# Patient Record
Sex: Female | Born: 1975 | Race: White | Hispanic: No | Marital: Married | Smoking: Former smoker
Health system: Southern US, Community
[De-identification: ages and names within clinical notes are randomized; demographics above are authoritative.]

## PROBLEM LIST (undated history)

## (undated) ENCOUNTER — Inpatient Hospital Stay (HOSPITAL_COMMUNITY): Payer: Self-pay

## (undated) DIAGNOSIS — U071 COVID-19: Secondary | ICD-10-CM

## (undated) DIAGNOSIS — I739 Peripheral vascular disease, unspecified: Secondary | ICD-10-CM

## (undated) HISTORY — DX: COVID-19: U07.1

## (undated) HISTORY — PX: TONSILLECTOMY AND ADENOIDECTOMY: SHX28

## (undated) HISTORY — DX: Peripheral vascular disease, unspecified: I73.9

## (undated) HISTORY — PX: CHOLECYSTECTOMY: SHX55

---

## 2008-01-10 ENCOUNTER — Encounter: Admission: RE | Admit: 2008-01-10 | Discharge: 2008-01-10 | Payer: Self-pay | Admitting: Obstetrics & Gynecology

## 2008-03-08 ENCOUNTER — Encounter (INDEPENDENT_AMBULATORY_CARE_PROVIDER_SITE_OTHER): Payer: Self-pay | Admitting: Obstetrics and Gynecology

## 2008-03-08 ENCOUNTER — Inpatient Hospital Stay (HOSPITAL_COMMUNITY): Admission: AD | Admit: 2008-03-08 | Discharge: 2008-03-13 | Payer: Self-pay | Admitting: Obstetrics & Gynecology

## 2009-07-30 ENCOUNTER — Ambulatory Visit (HOSPITAL_COMMUNITY): Admission: RE | Admit: 2009-07-30 | Discharge: 2009-07-30 | Payer: Self-pay | Admitting: Gastroenterology

## 2009-09-06 ENCOUNTER — Encounter (INDEPENDENT_AMBULATORY_CARE_PROVIDER_SITE_OTHER): Payer: Self-pay | Admitting: General Surgery

## 2009-09-06 ENCOUNTER — Ambulatory Visit (HOSPITAL_COMMUNITY): Admission: RE | Admit: 2009-09-06 | Discharge: 2009-09-06 | Payer: Self-pay | Admitting: General Surgery

## 2010-01-26 IMAGING — NM NM HEPATO W/GB/PHARM/[PERSON_NAME]
2 series · 12 of 12 positions shown · non-contrast
Comparison: None

CLINICAL DATA: History given of abdominal pain

NUCLEAR MEDICINE HEPATOBILIARY IMAGING WITH GALLBLADDER EF
TECHNIQUE: Sequential images of the abdomen were obtained [DATE]
minutes following intravenous administration of
radiopharmaceutical. After slow intravenous infusion of 2.0 uCg
Cholecystokinin, gallbladder ejection fraction was determined.
Radiopharmaceutical: 4.5 mCi 6c-LLm Choletec

[Series 1: he hepato · 4.71mm/px · 6 of 60 frames shown (1 of 2)]
[frame 6/60]
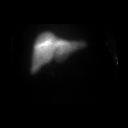
[frame 16/60]
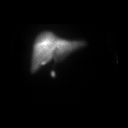
[frame 26/60]
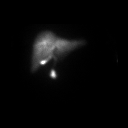
[frame 36/60]
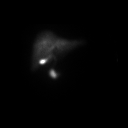
[frame 46/60]
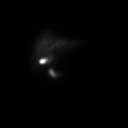
[frame 56/60]
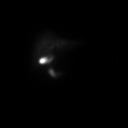

[Series 1: he hepato · 4.71mm/px · 6 of 30 frames shown (2 of 2)]
[frame 3/30]
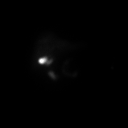
[frame 8/30]
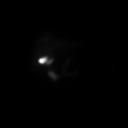
[frame 13/30]
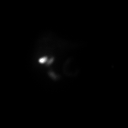
[frame 18/30]
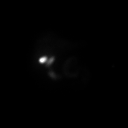
[frame 23/30]
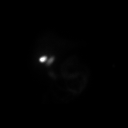
[frame 28/30]
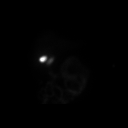

[12 of 12 positions shown; findings below may reference images not displayed]

FINDINGS: There is prompt visualization of hepatic activity. There
is prompt visualization of the common bile duct. Subsequently
intestinal activity was identified.

The gallbladder began to visualize at 15 minutes.

During CCK infusion the gallbladder contracted 10.6%. 30% or
greater is normal range.

During CCK infusion the patient reported abdominal pain.
IMPRESSION: There is demonstration of patency of the common bile duct and the
cystic duct. There is no evidence of cholecystitis.

During CCK infusion the gallbladder contracted 10.6%.  This is
abnormally low. 30% or greater is normal range.

During CCK infusion the patient reported abdominal pain.

## 2011-02-04 LAB — BASIC METABOLIC PANEL
Calcium: 9 mg/dL (ref 8.4–10.5)
GFR calc Af Amer: >60 mL/min (ref >60)
GFR calc non Af Amer: >60 mL/min (ref >60)
Potassium: 4.7 mEq/L (ref 3.5–5.1)
Sodium: 139 mEq/L (ref 135–145)

## 2011-02-04 LAB — DIFFERENTIAL
Basophils Absolute: 0.2 10*3/uL — ABNORMAL HIGH (ref 0.0–0.1)
Eosinophils Relative: 1 % (ref 0–5)
Lymphocytes Relative: 42 % (ref 12–46)
Lymphs Abs: 4 10*3/uL (ref 0.7–4.0)
Monocytes Absolute: 0.4 10*3/uL (ref 0.1–1.0)
Monocytes Relative: 4 % (ref 3–12)
Neutro Abs: 4.9 10*3/uL (ref 1.7–7.7)

## 2011-02-04 LAB — CBC
HCT: 37.8 % (ref 36.0–46.0)
Hemoglobin: 12.7 g/dL (ref 12.0–15.0)
RBC: 4.28 MIL/uL (ref 3.87–5.11)
RDW: 14.1 % (ref 11.5–15.5)
WBC: 9.4 10*3/uL (ref 4.0–10.5)

## 2011-03-17 NOTE — Op Note (Signed)
NAMEBORGHILD, THAKER          ACCOUNT NO.:  [id number]   MEDICAL RECORD NO.:  [id number]          PATIENT TYPE:  INP   LOCATION:  9372                          FACILITY:  [facility]   PHYSICIAN:  Maxie Better, M.D.DATE OF BIRTH:  May 07, 1976   DATE OF PROCEDURE:  03/08/2008  DATE OF DISCHARGE:                               OPERATIVE REPORT   PREOPERATIVE DIAGNOSES:  1. Severe preeclampsia, class A2 gestational diabetes.  2. Intrauterine gestation at 38+ weeks.  3. Transverse lie.  4:  Fetal macrosomia   PROCEDURE:  Primary cesarean section Kerr hysterotomy.   POSTOPERATIVE DIAGNOSES:  1. Polyhydramnios.  2. Fetal macrosomia, class A2 gestational diabetes.  3. Severe preeclampsia.  4. Intrauterine gestation at 38+ weeks.  5. Vertex presentation, class A2 gestational diabetes.   ANESTHESIA:  Spinal.   SURGEON:  Maxie Better, MD   ASSISTANT:  Marlinda Mike, CNM   INDICATIONS:  This is 35 year old gravida 1, para 0 female at 51+ weeks'  gestation with class A2 gestational diabetes, who was found to have  preeclampsia.  Estimated fetal weight of 10 pounds 6 ounces and  transverse presentation, who is now being admitted for primary cesarean  section.  Surgical risk was reviewed with the patient and her family.  Consent was signed and the patient was transferred to the operating  room.   PROCEDURE:  Under adequate spinal anesthesia, the patient was placed in  a supine position with a left lateral tilt.  She was sterilely prepped  and draped in usual fashion.  An indwelling Foley catheter was sterilely  placed.  Pfannenstiel skin incision was made above the pannus.  After  0.25% Marcaine was injected, the incision was carried down to the rectus  fascia.  The rectus fascia was opened transversely.  The rectus fascia  was then bluntly and sharply dissected off the rectus muscle in superior  and inferior fashion.  The rectus muscles was split in midline, parietal  peritoneum was entered bluntly and extended.  The vesicouterine  peritoneum was opened transversely.  The vertex presentation was well  audible.  The bladder was bluntly dissected off the lower uterine  segment and displaced inferiorly with a bladder retraction.  Curvilinear  low transverse uterine incision was then made and extended with bandage  scissors.  Artificial rupture of membranes was then done.  Clear copious  amniotic fluid was noted.  Vertex presentation was floating.  Initial  attempted delivery was unsuccessful.  Copious amount of blood suggestive  of placental separation was noted.  The vacuum was then applied with one  traction and pulled the subsequent delivery of a live female with cord  around the neck x2, which was reducible.  Baby was bulb suctioned in the  abdomen.  The cord was clamped and cut, the baby was transferred to the  awaiting pediatrician, who assigned Apgars of 8 and 9 at 1 and 5  minutes.  The placenta was spontaneously intact, sent to pathology.  Uterine cavity was cleaned of debris.  The uterus was exteriorized.  Uterine incision had no extension.  It was closed in two layers, the  first layer 0-Monocryl  with running locked stitch, second layer was  imbricated using 0-Monocryl suture.  Normal tubes and ovaries were noted  bilaterally.  The abdomen was then copiously irrigated and suctioned of  debris.  The uterus was then returned to the abdominal cavity.  Incision  was reinspected.  Good hemostasis noted.  The parietal peritoneum was  not closed.  The opening had been under the rectus muscle.  The rectus  fascia was closed with 0-Vicryl x2.  The subcutaneous area, which was  about 2-inch deep was closed with two layers of 2-0 plain sutures and  the skin was approximated using the Ethicon staples.   SPECIMENS:  Placenta sent to pathology.   ESTIMATED BLOOD LOSS:  700 mL.   INTRAOPERATIVE FLUID:  1800 mL crystalloid.   URINE OUTPUT:  100 mL clear  yellow urine.   Sponge and instrument counts x2 was correct.  Complication was none.  Weight of the baby was 12 pounds and 12 ounces.  The patient tolerated  the procedure well and was transferred to recovery in stable condition.      Maxie Better, M.D.  Electronically Signed     [place]/MEDQ  D:  03/08/2008  T:  03/09/2008  Job:  [id number]

## 2011-03-20 NOTE — Discharge Summary (Signed)
Katherine Walsh, Katherine Walsh          ACCOUNT NO.:  [id number]   MEDICAL RECORD NO.:  [id number]          PATIENT TYPE:  INP   LOCATION:  9145                          FACILITY:  [facility]   PHYSICIAN:  Maxie Better, M.D.DATE OF BIRTH:  03-03-1976   DATE OF ADMISSION:  03/08/2008  DATE OF DISCHARGE:  03/13/2008                               DISCHARGE SUMMARY   ADMISSION DIAGNOSES:  1. Fetal macrosomia.  2. Severe preeclampsia.  3. Class A2 gestational diabetes.  4. Transverse lie.  5. Intrauterine gestation at 38+ weeks.   POSTOPERATIVE DIAGNOSES:  1. Polyhydramnios.  2. Fetal macrosomia.  3. Class A2 gestational diabetes.  4. Severe preeclampsia.  5. Intrauterine gestation at 38 weeks, delivered.   PROCEDURE:  Primary cesarean section, Kerr hysterotomy.   HOSPITAL COURSE:  This is a patient of Dr. Seymour Bars, who has class A2  gestational diabetes and was found to have fetal macrosomia with  transverse lie being admitted secondary to her estimated fetal weight  and presumed severe preeclampsia.  The estimated fetal weight was 10  pounds 6 ounces.  The patient was found to be transverse lie.  Blood  pressure was 145/92.  She was on insulin for her diabetes, which was  being managed in the office.  On admission, PIH labs were obtained,  which were notable with normal except for the elevation of her uric  acid.  The patient was taken to the operating room, where she underwent  a primary cesarean section, live female, cord around the neck x2, 12  pounds 12 ounces, head tilted from a transverse to the vertex  presentation.  By the time she was taken to the operating room, normal  tubes and ovaries were noted.  Postoperatively, the patient was placed  on magnesium sulfate.  She was placed in the Intensive Care Unit for  close monitoring.  PIH labs were obtained serially.  Blood pressures on  postop day #1 ranged between 132-160 over 88-102.  She was continued on  magnesium until her  diuresis occurred.  The diagnosis of severe  preeclampsia had been made with the patient's urine total protein  collection noted to be 6182 mg of protein.  The patient had blood sugar  monitoring performed.  Labetalol was started because of continued  elevation of her blood pressure.  Magnesium sulfate was discontinued  after the patient was diuresing.  She was placed on nifedipine 10 mg  p.o. t.i.d. and Labetalol 800 mg p.o. t.i.d.  By postop day #5, her  blood pressures were 139-152 over 90-93.  Her blood sugars had improved.  She was deemed well for discharge home.  Her incision had no erythema,  induration, or exudate.  Staples were not removed.  Her CBC on postop  day #1 has showed a hematocrit 34.7,  hemoglobin of 11.9, platelet count  of 176,000, and white count of 9.8.   DISPOSITION:  Home.   CONDITION:  Stable.   DISCHARGE MEDICATIONS:  1. Nifedipine 10 mg every 8 hours.  2. Labetalol 400 mg every 8 hours.  3. Motrin 600-800 mg every 8 hours.  4. Percocet 1-2 tablets every 6  hours p.r.n. pain.  5. Prenatal vitamins 1 p.o. daily.   FOLLOWUP APPOINTMENT:  With [facility] for blood pressure check and  staple removal on Thursday or Friday.   DISCHARGE INSTRUCTIONS:  Per the postpartum booklet given.      Maxie Better, M.D.  Electronically Signed     [place]/MEDQ  D:  04/22/2008  T:  04/23/2008  Job:  [id number]

## 2021-04-08 ENCOUNTER — Other Ambulatory Visit: Payer: Self-pay

## 2021-04-08 DIAGNOSIS — U071 COVID-19: Secondary | ICD-10-CM

## 2021-04-08 NOTE — Addendum Note (Signed)
Addended byWaynetta Pean on: 04/08/2021 05:10 PM   Modules accepted: Orders

## 2021-04-08 NOTE — Progress Notes (Addendum)
Error, Patient does need imaging

## 2021-04-09 ENCOUNTER — Encounter (HOSPITAL_COMMUNITY): Payer: Self-pay

## 2021-04-09 ENCOUNTER — Encounter: Payer: Self-pay | Admitting: *Deleted

## 2021-04-09 ENCOUNTER — Ambulatory Visit (INDEPENDENT_AMBULATORY_CARE_PROVIDER_SITE_OTHER): Payer: 59 | Admitting: Vascular Surgery

## 2021-04-09 ENCOUNTER — Ambulatory Visit (HOSPITAL_COMMUNITY)
Admission: RE | Admit: 2021-04-09 | Discharge: 2021-04-09 | Disposition: A | Discharge status: Home / Self Care | Payer: 59 | Source: Ambulatory Visit | Attending: Vascular Surgery | Admitting: Vascular Surgery

## 2021-04-09 ENCOUNTER — Encounter: Payer: Self-pay | Admitting: Vascular Surgery

## 2021-04-09 ENCOUNTER — Other Ambulatory Visit: Payer: Self-pay | Admitting: *Deleted

## 2021-04-09 ENCOUNTER — Other Ambulatory Visit: Payer: Self-pay

## 2021-04-09 VITALS — BP 141/96 | HR 100 | Temp 98.5°F | Resp 20 | Ht 69.0 in | Wt 220.0 lb

## 2021-04-09 DIAGNOSIS — L97909 Non-pressure chronic ulcer of unspecified part of unspecified lower leg with unspecified severity: Secondary | ICD-10-CM

## 2021-04-09 DIAGNOSIS — U071 COVID-19: Secondary | ICD-10-CM | POA: Insufficient documentation

## 2021-04-09 DIAGNOSIS — I70299 Other atherosclerosis of native arteries of extremities, unspecified extremity: Secondary | ICD-10-CM | POA: Diagnosis not present

## 2021-04-09 DIAGNOSIS — R238 Other skin changes: Secondary | ICD-10-CM | POA: Diagnosis present

## 2021-04-09 NOTE — Progress Notes (Signed)
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  She has nonhealing wounds of the right first and second toes.  I think this could become a limb threatening problem.  This reason I recommended that we proceed with arteriography. I have reviewed with the patient the indications for arteriography. In addition, I have reviewed the potential complications of arteriography including but not limited to: Bleeding, arterial injury, arterial thrombosis, dye action, renal insufficiency, or other unpredictable medical problems. I have explained to the patient that if we find disease amenable to angioplasty we could potentially address this at the same time. I have discussed the potential complications of angioplasty and stenting, including but not limited to: Bleeding, arterial thrombosis, arterial injury, dissection, or the need for surgical intervention.  This has been scheduled for 04/11/2021.  I will make further recommendations pending these results.  In addition we have discussed the importance of tobacco cessation (3 min).   REASON FOR CONSULT:    Ischemic right foot.  The consult is requested by Sander Nephew, DPM  HPI:   Katherine Walsh is a 45 y.o. female who was referred with ischemia of the right forefoot.  I have reviewed the records from the referring office.  The patient was seen on 04/08/2021 with redness at the first and second toes of the right foot which had been going on for 5 weeks.  The patient had a COVID infection and illness about 6 weeks prior to this.  She smokes half a pack of cigarettes per day.  On my history, the patient has a history of ingrown toenails and had developed some redness in the first and second toes.  This was prior to her her developing COVID on May 11.  After she developed COVID she does feel like discoloration in her toes did worsen some.  Prior to this issue with her  toes, I do not get any clear-cut history of claudication or rest pain.  Risk factors for peripheral vascular disease include tobacco use and a family history of premature cardiovascular disease.  She smokes a half a pack per day and has been smoking for 12 years.  Her brother had a heart attack at age 27.  She denies any history of diabetes, hypertension, or hypercholesterolemia.  However she is tells me that she is working on getting a primary care physician.  She denies any fever or chills.  Past Medical History:  Diagnosis Date  . COVID     History reviewed. No pertinent family history.  SOCIAL HISTORY: Social History   Tobacco Use  . Smoking status: Current Every Day Smoker    Packs/day: 0.50    Types: Cigarettes  . Smokeless tobacco: Never Used  Substance Use Topics  . Alcohol use: Yes    Comment: occasional    Allergies  Allergen Reactions  . Codeine     Other reaction(s): Unknown  . Neosporin [Bacitracin-Polymyxin B]     Other reaction(s): Unknown    No current outpatient medications on file.   No current facility-administered medications for this visit.    REVIEW OF SYSTEMS:  [X]  denotes positive finding, [ ]  denotes negative finding Cardiac  Comments:  Chest pain or chest pressure:    Shortness of breath upon exertion:    Short of breath when lying flat:    Irregular heart rhythm:        Vascular    Pain in calf,  thigh, or hip brought on by ambulation: x   Pain in feet at night that wakes you up from your sleep:     Blood clot in your veins:    Leg swelling:         Pulmonary    Oxygen at home:    Productive cough:     Wheezing:         Neurologic    Sudden weakness in arms or legs:     Sudden numbness in arms or legs:     Sudden onset of difficulty speaking or slurred speech:    Temporary loss of vision in one eye:     Problems with dizziness:         Gastrointestinal    Blood in stool:     Vomited blood:         Genitourinary    Burning  when urinating:     Blood in urine:        Psychiatric    Major depression:         Hematologic    Bleeding problems:    Problems with blood clotting too easily:        Skin    Rashes or ulcers:        Constitutional    Fever or chills:    -  PHYSICAL EXAM:   Vitals:   04/09/21 1536  BP: (!) 141/96  Pulse: 100  Resp: 20  Temp: 98.5 F (36.9 C)  SpO2: 98%  Weight: 220 lb (99.8 kg)  Height: 5' 9" (1.753 m)   Body mass index is 32.49 kg/m. GENERAL: The patient is a well-nourished female, in no acute distress. The vital signs are documented above. CARDIAC: There is a regular rate and rhythm.  VASCULAR: I do not detect carotid bruits. On the right side, which is the side of concern, she has a palpable femoral pulse.  I cannot palpate a popliteal or pedal pulses. On the left side she has a palpable femoral, popliteal, and dorsalis pedis pulse. She has some mild swelling in the right foot. PULMONARY: There is good air exchange bilaterally without wheezing or rales. ABDOMEN: Soft and non-tender with normal pitched bowel sounds.  MUSCULOSKELETAL: There are no major deformities. NEUROLOGIC: No focal weakness or paresthesias are detected. SKIN: She has wounds on the right first and second toes as documented in the photographs below.  She has some swelling and redness in the toes.       PSYCHIATRIC: The patient has a normal affect.  DATA:    ARTERIAL DOPPLER STUDY: I have independently interpreted her arterial Doppler study.  On the right side there is a monophasic posterior tibial signal.  There is a dampened monophasic dorsalis pedis signal.  ABI is 47%.  Toe pressures 33 mmHg.  On the left side there is a triphasic posterior tibial signal with a biphasic dorsalis pedis signal.  ABIs 100%.  Toe pressures 161 mmHg.  Katherine Walsh [facility] 

## 2021-04-09 NOTE — H&P (View-Only) (Signed)
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  She has nonhealing wounds of the right first and second toes.  I think this could become a limb threatening problem.  This reason I recommended that we proceed with arteriography. I have reviewed with the patient the indications for arteriography. In addition, I have reviewed the potential complications of arteriography including but not limited to: Bleeding, arterial injury, arterial thrombosis, dye action, renal insufficiency, or other unpredictable medical problems. I have explained to the patient that if we find disease amenable to angioplasty we could potentially address this at the same time. I have discussed the potential complications of angioplasty and stenting, including but not limited to: Bleeding, arterial thrombosis, arterial injury, dissection, or the need for surgical intervention.  This has been scheduled for 04/11/2021.  I will make further recommendations pending these results.  In addition we have discussed the importance of tobacco cessation (3 min).   REASON FOR CONSULT:    Ischemic right foot.  The consult is requested by Sander Nephew, DPM  HPI:   Katherine Walsh is a 45 y.o. female who was referred with ischemia of the right forefoot.  I have reviewed the records from the referring office.  The patient was seen on 04/08/2021 with redness at the first and second toes of the right foot which had been going on for 5 weeks.  The patient had a COVID infection and illness about 6 weeks prior to this.  She smokes half a pack of cigarettes per day.  On my history, the patient has a history of ingrown toenails and had developed some redness in the first and second toes.  This was prior to her her developing COVID on May 11.  After she developed COVID she does feel like discoloration in her toes did worsen some.  Prior to this issue with her  toes, I do not get any clear-cut history of claudication or rest pain.  Risk factors for peripheral vascular disease include tobacco use and a family history of premature cardiovascular disease.  She smokes a half a pack per day and has been smoking for 12 years.  Her brother had a heart attack at age 27.  She denies any history of diabetes, hypertension, or hypercholesterolemia.  However she is tells me that she is working on getting a primary care physician.  She denies any fever or chills.  Past Medical History:  Diagnosis Date  . COVID     History reviewed. No pertinent family history.  SOCIAL HISTORY: Social History   Tobacco Use  . Smoking status: Current Every Day Smoker    Packs/day: 0.50    Types: Cigarettes  . Smokeless tobacco: Never Used  Substance Use Topics  . Alcohol use: Yes    Comment: occasional    Allergies  Allergen Reactions  . Codeine     Other reaction(s): Unknown  . Neosporin [Bacitracin-Polymyxin B]     Other reaction(s): Unknown    No current outpatient medications on file.   No current facility-administered medications for this visit.    REVIEW OF SYSTEMS:  [X]  denotes positive finding, [ ]  denotes negative finding Cardiac  Comments:  Chest pain or chest pressure:    Shortness of breath upon exertion:    Short of breath when lying flat:    Irregular heart rhythm:        Vascular    Pain in calf,  thigh, or hip brought on by ambulation: x   Pain in feet at night that wakes you up from your sleep:     Blood clot in your veins:    Leg swelling:         Pulmonary    Oxygen at home:    Productive cough:     Wheezing:         Neurologic    Sudden weakness in arms or legs:     Sudden numbness in arms or legs:     Sudden onset of difficulty speaking or slurred speech:    Temporary loss of vision in one eye:     Problems with dizziness:         Gastrointestinal    Blood in stool:     Vomited blood:         Genitourinary    Burning  when urinating:     Blood in urine:        Psychiatric    Major depression:         Hematologic    Bleeding problems:    Problems with blood clotting too easily:        Skin    Rashes or ulcers:        Constitutional    Fever or chills:    -  PHYSICAL EXAM:   Vitals:   04/09/21 1536  BP: (!) 141/96  Pulse: 100  Resp: 20  Temp: 98.5 F (36.9 C)  SpO2: 98%  Weight: 220 lb (99.8 kg)  Height: 5\' 9"  (1.753 m)   Body mass index is 32.49 kg/m. GENERAL: The patient is a well-nourished female, in no acute distress. The vital signs are documented above. CARDIAC: There is a regular rate and rhythm.  VASCULAR: I do not detect carotid bruits. On the right side, which is the side of concern, she has a palpable femoral pulse.  I cannot palpate a popliteal or pedal pulses. On the left side she has a palpable femoral, popliteal, and dorsalis pedis pulse. She has some mild swelling in the right foot. PULMONARY: There is good air exchange bilaterally without wheezing or rales. ABDOMEN: Soft and non-tender with normal pitched bowel sounds.  MUSCULOSKELETAL: There are no major deformities. NEUROLOGIC: No focal weakness or paresthesias are detected. SKIN: She has wounds on the right first and second toes as documented in the photographs below.  She has some swelling and redness in the toes.       PSYCHIATRIC: The patient has a normal affect.  DATA:    ARTERIAL DOPPLER STUDY: I have independently interpreted her arterial Doppler study.  On the right side there is a monophasic posterior tibial signal.  There is a dampened monophasic dorsalis pedis signal.  ABI is 47%.  Toe pressures 33 mmHg.  On the left side there is a triphasic posterior tibial signal with a biphasic dorsalis pedis signal.  ABIs 100%.  Toe pressures 161 mmHg.  [facility]

## 2021-04-11 ENCOUNTER — Ambulatory Visit (HOSPITAL_COMMUNITY)
Admission: RE | Admit: 2021-04-11 | Discharge: 2021-04-11 | Disposition: A | Discharge status: Home / Self Care | Payer: 59 | Attending: Vascular Surgery | Admitting: Vascular Surgery

## 2021-04-11 ENCOUNTER — Other Ambulatory Visit: Payer: Self-pay

## 2021-04-11 ENCOUNTER — Ambulatory Visit (HOSPITAL_COMMUNITY)
Admission: RE | Disposition: A | Discharge status: Home / Self Care | Payer: Self-pay | Source: Home / Self Care | Attending: Vascular Surgery

## 2021-04-11 DIAGNOSIS — I739 Peripheral vascular disease, unspecified: Secondary | ICD-10-CM | POA: Insufficient documentation

## 2021-04-11 DIAGNOSIS — Z885 Allergy status to narcotic agent status: Secondary | ICD-10-CM | POA: Insufficient documentation

## 2021-04-11 DIAGNOSIS — I708 Atherosclerosis of other arteries: Secondary | ICD-10-CM | POA: Diagnosis not present

## 2021-04-11 DIAGNOSIS — I70235 Atherosclerosis of native arteries of right leg with ulceration of other part of foot: Secondary | ICD-10-CM

## 2021-04-11 DIAGNOSIS — Z8616 Personal history of COVID-19: Secondary | ICD-10-CM | POA: Insufficient documentation

## 2021-04-11 DIAGNOSIS — F17210 Nicotine dependence, cigarettes, uncomplicated: Secondary | ICD-10-CM | POA: Insufficient documentation

## 2021-04-11 DIAGNOSIS — Z883 Allergy status to other anti-infective agents status: Secondary | ICD-10-CM | POA: Insufficient documentation

## 2021-04-11 DIAGNOSIS — I70234 Atherosclerosis of native arteries of right leg with ulceration of heel and midfoot: Secondary | ICD-10-CM

## 2021-04-11 HISTORY — PX: ABDOMINAL AORTOGRAM W/LOWER EXTREMITY: CATH118223

## 2021-04-11 HISTORY — PX: PERIPHERAL VASCULAR INTERVENTION: CATH118257

## 2021-04-11 LAB — POCT I-STAT, CHEM 8
BUN: 6 mg/dL (ref 6–20)
Calcium, Ion: 1.09 mmol/L — ABNORMAL LOW (ref 1.15–1.40)
Chloride: 108 mmol/L (ref 98–111)
Creatinine, Ser: 0.4 mg/dL — ABNORMAL LOW (ref 0.44–1.00)
Glucose, Bld: 294 mg/dL — ABNORMAL HIGH (ref 70–99)
HCT: 32 % — ABNORMAL LOW (ref 36.0–46.0)
Hemoglobin: 10.9 g/dL — ABNORMAL LOW (ref 12.0–15.0)
Potassium: 3.8 mmol/L (ref 3.5–5.1)
Sodium: 139 mmol/L (ref 135–145)
TCO2: 18 mmol/L — ABNORMAL LOW (ref 22–32)

## 2021-04-11 LAB — PREGNANCY, URINE: Preg Test, Ur: NEGATIVE

## 2021-04-11 LAB — POCT ACTIVATED CLOTTING TIME
Activated Clotting Time: 173 seconds
Activated Clotting Time: 190 seconds
Activated Clotting Time: 196 seconds

## 2021-04-11 SURGERY — ABDOMINAL AORTOGRAM W/LOWER EXTREMITY
Anesthesia: LOCAL | Laterality: Right

## 2021-04-11 MED ORDER — CLOPIDOGREL BISULFATE 75 MG PO TABS: 75.0000 mg | ORAL_TABLET | Freq: Every day | ORAL | 11 refills | Status: DC

## 2021-04-11 MED ORDER — HEPARIN (PORCINE) IN NACL 1000-0.9 UT/500ML-% IV SOLN
INTRAVENOUS | Status: DC | PRN
  Administered 2021-04-11 (×2): 500 mL

## 2021-04-11 MED ORDER — PROTAMINE SULFATE 10 MG/ML IV SOLN
INTRAVENOUS | Status: DC | PRN
  Administered 2021-04-11: 5 mg via INTRAVENOUS
  Administered 2021-04-11: 25 mg via INTRAVENOUS

## 2021-04-11 MED ORDER — ASPIRIN EC 81 MG PO TBEC: 81.0000 mg | DELAYED_RELEASE_TABLET | Freq: Every day | ORAL | 2 refills | Status: AC

## 2021-04-11 MED ORDER — SODIUM CHLORIDE 0.9 % WEIGHT BASED INFUSION: 1.0000 mL/kg/h | INTRAVENOUS | Status: DC

## 2021-04-11 MED ORDER — SODIUM CHLORIDE 0.9 % IV SOLN: INTRAVENOUS | Status: DC

## 2021-04-11 MED ORDER — MIDAZOLAM HCL 2 MG/2ML IJ SOLN
INTRAMUSCULAR | Status: DC | PRN
  Administered 2021-04-11: 1 mg via INTRAVENOUS

## 2021-04-11 MED ORDER — ASPIRIN EC 81 MG PO TBEC: 81.0000 mg | DELAYED_RELEASE_TABLET | Freq: Every day | ORAL | Status: DC

## 2021-04-11 MED ORDER — HEPARIN SODIUM (PORCINE) 1000 UNIT/ML IJ SOLN
INTRAMUSCULAR | Status: AC
  Filled 2021-04-11: qty 1

## 2021-04-11 MED ORDER — CLOPIDOGREL BISULFATE 75 MG PO TABS: 75.0000 mg | ORAL_TABLET | Freq: Every day | ORAL | Status: DC

## 2021-04-11 MED ORDER — SODIUM CHLORIDE 0.9 % IV SOLN: 250.0000 mL | INTRAVENOUS | Status: DC | PRN

## 2021-04-11 MED ORDER — ONDANSETRON HCL 4 MG/2ML IJ SOLN: 4.0000 mg | Freq: Four times a day (QID) | INTRAMUSCULAR | Status: DC | PRN

## 2021-04-11 MED ORDER — CLOPIDOGREL BISULFATE 75 MG PO TABS
ORAL_TABLET | ORAL | Status: AC
  Filled 2021-04-11: qty 1

## 2021-04-11 MED ORDER — HYDRALAZINE HCL 20 MG/ML IJ SOLN: 5.0000 mg | INTRAMUSCULAR | Status: DC | PRN

## 2021-04-11 MED ORDER — MIDAZOLAM HCL 2 MG/2ML IJ SOLN
INTRAMUSCULAR | Status: AC
  Filled 2021-04-11: qty 2

## 2021-04-11 MED ORDER — LABETALOL HCL 5 MG/ML IV SOLN: 10.0000 mg | INTRAVENOUS | Status: DC | PRN

## 2021-04-11 MED ORDER — ATORVASTATIN CALCIUM 10 MG PO TABS
10.0000 mg | ORAL_TABLET | Freq: Every day | ORAL | Status: DC
Start: 2021-04-11 — End: 2021-04-11

## 2021-04-11 MED ORDER — FENTANYL CITRATE (PF) 100 MCG/2ML IJ SOLN
INTRAMUSCULAR | Status: AC
  Filled 2021-04-11: qty 2

## 2021-04-11 MED ORDER — LIDOCAINE HCL (PF) 1 % IJ SOLN
INTRAMUSCULAR | Status: AC
  Filled 2021-04-11: qty 30

## 2021-04-11 MED ORDER — FENTANYL CITRATE (PF) 100 MCG/2ML IJ SOLN
INTRAMUSCULAR | Status: DC | PRN
  Administered 2021-04-11: 50 ug via INTRAVENOUS

## 2021-04-11 MED ORDER — IODIXANOL 320 MG/ML IV SOLN
INTRAVENOUS | Status: DC | PRN
Start: 2021-04-11 — End: 2021-04-11
  Administered 2021-04-11: 160 mL via INTRA_ARTERIAL

## 2021-04-11 MED ORDER — ATORVASTATIN CALCIUM 10 MG PO TABS: 10.0000 mg | ORAL_TABLET | Freq: Every day | ORAL | 11 refills | Status: DC

## 2021-04-11 MED ORDER — PROTAMINE SULFATE 10 MG/ML IV SOLN
INTRAVENOUS | Status: AC
  Filled 2021-04-11: qty 5

## 2021-04-11 MED ORDER — ACETAMINOPHEN 325 MG PO TABS
650.0000 mg | ORAL_TABLET | ORAL | Status: DC | PRN
  Administered 2021-04-11: 650 mg via ORAL
  Filled 2021-04-11: qty 2

## 2021-04-11 MED ORDER — LIDOCAINE HCL (PF) 1 % IJ SOLN
INTRAMUSCULAR | Status: DC | PRN
  Administered 2021-04-11: 20 mL via INTRADERMAL

## 2021-04-11 MED ORDER — SODIUM CHLORIDE 0.9% FLUSH: 3.0000 mL | INTRAVENOUS | Status: DC | PRN

## 2021-04-11 MED ORDER — SODIUM CHLORIDE 0.9% FLUSH: 3.0000 mL | Freq: Two times a day (BID) | INTRAVENOUS | Status: DC

## 2021-04-11 MED ORDER — CLOPIDOGREL BISULFATE 75 MG PO TABS
ORAL_TABLET | ORAL | Status: DC | PRN
  Administered 2021-04-11: 150 mg via ORAL

## 2021-04-11 SURGICAL SUPPLY — 16 items
BALLN MUSTANG 10X20X75 (BALLOONS) ×3
BALLOON MUSTANG 10X20X75 (BALLOONS) ×2 IMPLANT
CATH ANGIO 5F PIGTAIL 65CM (CATHETERS) ×3 IMPLANT
CATH CROSS OVER TEMPO 5F (CATHETERS) ×3 IMPLANT
CLOSURE PERCLOSE PROSTYLE (VASCULAR PRODUCTS) ×12 IMPLANT
KIT ENCORE 26 ADVANTAGE (KITS) ×3 IMPLANT
KIT MICROPUNCTURE NIT STIFF (SHEATH) ×3 IMPLANT
KIT PV (KITS) ×3 IMPLANT
SHEATH PINNACLE 5F 10CM (SHEATH) ×3 IMPLANT
SHEATH PINNACLE MP 7F 45CM (SHEATH) ×3 IMPLANT
SHEATH PROBE COVER 6X72 (BAG) ×3 IMPLANT
STENT VIABAHN 8X29X80 VBX (Permanent Stent) ×3 IMPLANT
TRANSDUCER W/STOPCOCK (MISCELLANEOUS) ×3 IMPLANT
TRAY PV CATH (CUSTOM PROCEDURE TRAY) ×3 IMPLANT
WIRE AMPLATZ SS-J .035X180CM (WIRE) ×3 IMPLANT
WIRE HITORQ VERSACORE ST 145CM (WIRE) ×3 IMPLANT

## 2021-04-11 NOTE — Op Note (Signed)
PATIENT: Katherine Walsh      MRN: [id number] DOB: 12-Jun-1976    DATE OF PROCEDURE: 04/11/2021  INDICATIONS:    CAMAY PEDIGO is a 45 y.o. female who presented with wounds on her right first and second toes and an ischemic right forefoot.  She had evidence of infrainguinal arterial occlusive disease.  She presents for arteriography and possible intervention.  PROCEDURE:    Conscious sedation Ultrasound-guided access to the left common femoral artery Aortogram with bilateral iliac arteriogram and bilateral lower extremity runoff Selective catheterization of the right common iliac artery with angioplasty and stenting of the left common iliac artery (8 mm x 39 mm VBX covered stent) 5.  Attempted Perclose left femoral artery  SURGEON: Di Kindle. Edilia Bo, MD, FACS  ANESTHESIA: Local with sedation  EBL: Minimal  TECHNIQUE: The patient was brought to the peripheral vascular lab and was sedated. The period of conscious sedation was 108 minutes.  During that time period, I was present face-to-face 100% of the time.  The patient was administered 1 mg of Versed and 50 mcg of fentanyl. The patient's heart rate, blood pressure, and oxygen saturation were monitored by the nurse continuously during the procedure.  Both groins were prepped and draped in the usual sterile fashion.  Under ultrasound guidance, after the skin was anesthetized, I cannulated the left common femoral artery with a micropuncture needle and a micropuncture sheath was introduced over a wire.  This was exchanged for a 5 [place] sheath over a Bentson wire.  By ultrasound the femoral artery was patent. A real-time image was obtained and sent to the server.  The pigtail catheter was positioned at the L1 vertebral body and flush aortogram obtained.  The catheter was in position above the aortic bifurcation and an oblique iliac projection was obtained.  Next bilateral lower extremity runoff films were obtained.  There was  an approximately 50% irregular plaque in the proximal right common iliac artery which I felt was a source for embolization which could be demonstrated on the runoff films.  I elected to address this with angioplasty and stenting.  I cannulated the proximal right common iliac artery with a crossover catheter and advanced the wire into the external iliac artery and then advanced the catheter over the wire.  I then exchanged for an Amplatz wire.  I then exchanged the 5 French sheath in the left groin for a 7 [place] destination sheath which was advanced over the bifurcation into the right common iliac artery.  The patient was then heparinized and ACT was monitored throughout the procedure.  I selected an 8 mm x 29 mm VBX stent.  This was positioned in the area of concern and then the sheath retracted.  Several films were obtained to demonstrate the correct positioning for the stent which was then deployed without difficulty.  There was some Po stenotic dilatation where the artery flared and I addressed this by ballooning this with a 10 x 2 balloon and got good apposition after this.  Next follow-up film was obtained using the pigtail catheter which showed an excellent result.  I then attempted Perclose in the left groin.  The artery was quite deep and although I did make a nice tract I was having to push fairly hard in order to allow adequate positioning of the Perclose and I felt that I was having to apply an undue amount of pressure.  I therefore stopped and placed a 7 French sheath over the wire with plans  for manual compression for hemostasis.  The patient was transferred to the holding area.  No immediate complications were noted.  FINDINGS:   The patient has single renal arteries with no significant renal artery stenosis identified.  The infrarenal aorta is widely patent. On the right side, which is the side of concern, there was a 50% irregular right common iliac artery stenosis which was successfully  addressed with angioplasty and stenting as described above.  Below that the external iliac, internal iliac, and common femoral arteries are patent.  The superficial femoral, deep femoral, arteries are patent.  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  Below that the below-knee popliteal artery reconstitutes and the proximal anterior tibial artery is patent but then occludes in the mid calf.  The peroneal and posterior tibial arteries are occluded. On the left side the common femoral, deep femoral, superficial femoral, popliteal, anterior tibial, tibial peroneal trunk, peroneal, and posterior tibial arteries are patent.  There is a high takeoff of the anterior tibial artery.  The external iliac and common iliac artery on the left are widely patent as is the internal iliac artery.  CLINICAL NOTE: This patient appears to have embolized from the proximal right common iliac artery stenosis.  The patient has severe tibial disease and really no good options distally for revascularization.  I will follow the patient's wounds as an outpatient.  We have discussed again the importance of tobacco cessation.  I have started her on aspirin, a statin, and Plavix.  TASC Classification  Largest Sheath Size: 7 [place]  Target vessel: Right common iliac artery  % Stenosis: Pre 50%. Post 0%.  Lesion length: 3 cm  Calcification: No  Most impactful devices used (Up to 3): 8 mm x 39 mm VBX stent  Outflow: Disease present or not distal to the lesion treated and the  Flow in the distal vessel: Severe tibial artery occlusive disease distally.   Waverly Ferrari, MD, FACS [facility]  DATE OF DICTATION:   04/11/2021

## 2021-04-11 NOTE — Progress Notes (Signed)
Site area: Right groin a 7 french arterial sheath was removed  Site Prior to Removal:  Level 0  Pressure Applied For 20  MINUTES    Bedrest Beginning at 1210pm X 4 hours Manual:   Yes.    Patient Status During Pull:  stable  Post Pull Groin Site:  Level 0  Post Pull Instructions Given:  Yes.    Post Pull Pulses Present:  Yes.    Dressing Applied:  Yes.    Comments:

## 2021-04-11 NOTE — Interval H&P Note (Signed)
History and Physical Interval Note:  04/11/2021 9:41 AM  Katherine Walsh  has presented today for surgery, with the diagnosis of pad.  The various methods of treatment have been discussed with the patient and family. After consideration of risks, benefits and other options for treatment, the patient has consented to  Procedure(s): ABDOMINAL AORTOGRAM W/LOWER EXTREMITY (N/A) as a surgical intervention.  The patient's history has been reviewed, patient examined, no change in status, stable for surgery.  I have reviewed the patient's chart and labs.  Questions were answered to the patient's satisfaction.     Waverly Ferrari

## 2021-04-14 ENCOUNTER — Encounter (HOSPITAL_COMMUNITY): Payer: Self-pay | Admitting: Vascular Surgery

## 2021-04-14 MED FILL — Heparin Sodium (Porcine) Inj 1000 Unit/ML: INTRAMUSCULAR | Qty: 20 | Status: AC

## 2021-05-07 ENCOUNTER — Ambulatory Visit (INDEPENDENT_AMBULATORY_CARE_PROVIDER_SITE_OTHER): Payer: 59 | Admitting: Vascular Surgery

## 2021-05-07 ENCOUNTER — Other Ambulatory Visit: Payer: Self-pay

## 2021-05-07 ENCOUNTER — Encounter: Payer: Self-pay | Admitting: Vascular Surgery

## 2021-05-07 VITALS — BP 145/90 | HR 78 | Temp 97.4°F | Resp 20 | Ht 69.0 in | Wt 226.0 lb

## 2021-05-07 DIAGNOSIS — L97909 Non-pressure chronic ulcer of unspecified part of unspecified lower leg with unspecified severity: Secondary | ICD-10-CM

## 2021-05-07 DIAGNOSIS — I70299 Other atherosclerosis of native arteries of extremities, unspecified extremity: Secondary | ICD-10-CM

## 2021-05-07 NOTE — Progress Notes (Signed)
   Patient name: Katherine Walsh MRN: [id number] DOB: [birth year] Sex: female  REASON FOR VISIT:   Follow-up after arteriogram  HPI:   Katherine Walsh is a pleasant 45 y.o. female who presented with the wounds on her right first and second toes and an ischemic right forefoot.  She had evidence of infrainguinal arterial occlusive disease and underwent an arteriogram on 04/11/2021.  She had selective catheterization of the right common iliac artery with angioplasty and stenting of the left common iliac artery using an 8 mm x 39 mm VBX stent.  Today she tells me that she quit smoking on 04/10/2021.  The toe has been making steady progress in the right direction.  She has been walking without significant claudication.  She denies fever or chills.  Current Outpatient Medications  Medication Sig Dispense Refill   aspirin EC 81 MG tablet Take 1 tablet (81 mg total) by mouth daily. Swallow whole. 150 tablet 2   atorvastatin (LIPITOR) 10 MG tablet Take 1 tablet (10 mg total) by mouth daily. 30 tablet 11   clopidogrel (PLAVIX) 75 MG tablet Take 1 tablet (75 mg total) by mouth daily. 30 tablet 11   ibuprofen (ADVIL) 200 MG tablet Take 800 mg by mouth 2 (two) times daily as needed (pain.).     No current facility-administered medications for this visit.    REVIEW OF SYSTEMS:  [X]  denotes positive finding, [ ]  denotes negative finding Vascular    Leg swelling    Cardiac    Chest pain or chest pressure:    Shortness of breath upon exertion:    Short of breath when lying flat:    Irregular heart rhythm:    Constitutional    Fever or chills:     PHYSICAL EXAM:   Vitals:   05/07/21 1006  BP: (!) 145/90  Pulse: 78  Resp: 20  Temp: (!) 97.4 F (36.3 C)  SpO2: 98%  Weight: 226 lb (102.5 kg)  Height: 5\' 9"  (1.753 m)    GENERAL: The patient is a well-nourished female, in no acute distress. The vital signs are documented above. CARDIOVASCULAR: There is a regular rate and  rhythm. PULMONARY: There is good air exchange bilaterally without wheezing or rales. VASCULAR: She has normal femoral pulses. EXTREMITIES: Her right toe wound is improving as documented in the photographs below.      DATA:   No new data.  MEDICAL ISSUES:   PERIPHERAL VASCULAR DISEASE: This patient had an irregular stenosis in the proximal right common iliac artery which embolized to the right leg.  The common iliac artery stenosis was addressed with a covered stent to prevent further embolization.  Distally she has no distal patent vessels with no real good options for revascularization.  Fortunately the toe was improving and she is quit smoking.  I have ordered follow-up ABIs and a duplex of her stent in 3 months.  She knows to call sooner if she has problems.  She is on aspirin, Plavix, and a statin.  [facility] [phone]

## 2021-05-08 ENCOUNTER — Other Ambulatory Visit: Payer: Self-pay

## 2021-05-08 DIAGNOSIS — L97909 Non-pressure chronic ulcer of unspecified part of unspecified lower leg with unspecified severity: Secondary | ICD-10-CM

## 2021-05-08 DIAGNOSIS — I70299 Other atherosclerosis of native arteries of extremities, unspecified extremity: Secondary | ICD-10-CM

## 2021-08-07 ENCOUNTER — Ambulatory Visit (HOSPITAL_COMMUNITY)
Admission: RE | Admit: 2021-08-07 | Discharge: 2021-08-07 | Disposition: A | Discharge status: Home / Self Care | Payer: 59 | Source: Ambulatory Visit | Attending: Vascular Surgery | Admitting: Vascular Surgery

## 2021-08-07 ENCOUNTER — Ambulatory Visit (INDEPENDENT_AMBULATORY_CARE_PROVIDER_SITE_OTHER)
Admission: RE | Admit: 2021-08-07 | Discharge: 2021-08-07 | Disposition: A | Discharge status: Home / Self Care | Payer: 59 | Source: Ambulatory Visit | Attending: Vascular Surgery | Admitting: Vascular Surgery

## 2021-08-07 ENCOUNTER — Other Ambulatory Visit: Payer: Self-pay

## 2021-08-07 ENCOUNTER — Ambulatory Visit (INDEPENDENT_AMBULATORY_CARE_PROVIDER_SITE_OTHER): Payer: 59 | Admitting: Vascular Surgery

## 2021-08-07 ENCOUNTER — Encounter: Payer: Self-pay | Admitting: Vascular Surgery

## 2021-08-07 VITALS — BP 138/90 | HR 97 | Temp 98.2°F | Resp 20 | Ht 69.0 in | Wt 234.0 lb

## 2021-08-07 DIAGNOSIS — L97909 Non-pressure chronic ulcer of unspecified part of unspecified lower leg with unspecified severity: Secondary | ICD-10-CM | POA: Insufficient documentation

## 2021-08-07 DIAGNOSIS — I70299 Other atherosclerosis of native arteries of extremities, unspecified extremity: Secondary | ICD-10-CM | POA: Insufficient documentation

## 2021-08-07 NOTE — Progress Notes (Signed)
REASON FOR VISIT:   Follow-up after right common iliac artery angioplasty and stenting  MEDICAL ISSUES:   PERIPHERAL ARTERIAL DISEASE: This patient had presented with an ischemic right foot.  She likely has been embolizing from a right common iliac artery stenosis and had severe tibial artery occlusive disease.  The common iliac artery stenosis was addressed with a VBX stent and this is widely patent.  The toe wounds are healing.  She quit smoking.  She is on aspirin, Plavix, and a statin.  I have encouraged her to walk is much as possible.  I will plan on seeing her back in 6 months with follow-up duplex of her stent and ABIs.  There really are not any options on the right for revascularization but it looks like the toes are healing nicely as documented in the photograph below.  HPI:   Katherine Walsh is a pleasant 45 y.o. female who I saw on 04/09/2021 with a wound on her right first and second toes and peripheral arterial disease.  She was also noted to have an ischemic right forefoot.  Pictures of the wounds on her toes are documented in the visit on 04/09/2021.  On 04/11/2021 she underwent an arteriogram with angioplasty and stenting of the right common iliac artery with an 8 mm x 39 mm VBX stent.  The patient had severe distal disease and it appeared that she had embolized from her proximal right common iliac artery stenosis.  There was no good options for revascularization distally.  We discussed importance of tobacco cessation and she was started on aspirin, Plavix, and a statin.  Since I saw her last, she has successfully quit smoking.  She has been ambulating.  Her toe wounds are healing nicely.  She denies fever or chills.  Past Medical History:  Diagnosis Date   COVID    Peripheral vascular disease (HCC)     History reviewed. No pertinent family history.  SOCIAL HISTORY: Social History   Tobacco Use   Smoking status: Former    Packs/day: 0.50    Types: Cigarettes     Quit date: 04/10/2021    Years since quitting: 0.3   Smokeless tobacco: Never  Substance Use Topics   Alcohol use: Yes    Comment: occasional    Allergies  Allergen Reactions   Codeine Nausea Only   Neosporin [Bacitracin-Polymyxin B] Other (See Comments)    Unsure of reaction    Current Outpatient Medications  Medication Sig Dispense Refill   aspirin EC 81 MG tablet Take 1 tablet (81 mg total) by mouth daily. Swallow whole. 150 tablet 2   atorvastatin (LIPITOR) 10 MG tablet Take 1 tablet (10 mg total) by mouth daily. 30 tablet 11   clopidogrel (PLAVIX) 75 MG tablet Take 1 tablet (75 mg total) by mouth daily. 30 tablet 11   ibuprofen (ADVIL) 200 MG tablet Take 800 mg by mouth 2 (two) times daily as needed (pain.).     No current facility-administered medications for this visit.    REVIEW OF SYSTEMS:  [X]  denotes positive finding, [ ]  denotes negative finding Cardiac  Comments:  Chest pain or chest pressure:    Shortness of breath upon exertion:    Short of breath when lying flat:    Irregular heart rhythm:        Vascular    Pain in calf, thigh, or hip brought on by ambulation:    Pain in feet at night that wakes you up from your  sleep:     Blood clot in your veins:    Leg swelling:         Pulmonary    Oxygen at home:    Productive cough:     Wheezing:         Neurologic    Sudden weakness in arms or legs:     Sudden numbness in arms or legs:     Sudden onset of difficulty speaking or slurred speech:    Temporary loss of vision in one eye:     Problems with dizziness:         Gastrointestinal    Blood in stool:     Vomited blood:         Genitourinary    Burning when urinating:     Blood in urine:        Psychiatric    Major depression:         Hematologic    Bleeding problems:    Problems with blood clotting too easily:        Skin    Rashes or ulcers:        Constitutional    Fever or chills:     PHYSICAL EXAM:   Vitals:   08/07/21 0931  BP:  138/90  Pulse: 97  Resp: 20  Temp: 98.2 F (36.8 C)  SpO2: 98%  Weight: 234 lb (106.1 kg)  Height: 5\' 9"  (1.753 m)    GENERAL: The patient is a well-nourished female, in no acute distress. The vital signs are documented above. CARDIAC: There is a regular rate and rhythm.  VASCULAR: She has palpable femoral pulses. The right foot appears adequately perfused and the toe wounds are healing as documented below.   PULMONARY: There is good air exchange bilaterally without wheezing or rales. ABDOMEN: Soft and non-tender with normal pitched bowel sounds.  MUSCULOSKELETAL: There are no major deformities or cyanosis. NEUROLOGIC: No focal weakness or paresthesias are detected. SKIN: There are no ulcers or rashes noted. PSYCHIATRIC: The patient has a normal affect.  DATA:    ARTERIAL DOPPLER STUDY: I have independently interpreted her arterial Doppler study today.  On the right side there is a monophasic posterior tibial signal with a dampened monophasic dorsalis pedis signal.  ABIs 74% although this may be falsely elevated.  Toe pressures 25 mmHg.  On the left side there is a triphasic posterior tibial signal with a biphasic dorsalis pedis signal.  ABIs 100%.  Toe pressures 153 mmHg.  AORTOILIAC DUPLEX: I have independently interpreted her aortoiliac duplex scan today.   On the right side, the right common iliac artery stent is widely patent with no areas of significant stenosis noted.  On the left side the common iliac and external iliac arteries are patent.  [facility] [phone]

## 2021-08-08 ENCOUNTER — Other Ambulatory Visit: Payer: Self-pay

## 2021-08-08 DIAGNOSIS — L97909 Non-pressure chronic ulcer of unspecified part of unspecified lower leg with unspecified severity: Secondary | ICD-10-CM

## 2021-08-08 DIAGNOSIS — I70299 Other atherosclerosis of native arteries of extremities, unspecified extremity: Secondary | ICD-10-CM

## 2022-02-05 ENCOUNTER — Encounter: Payer: Self-pay | Admitting: Vascular Surgery

## 2022-02-05 ENCOUNTER — Ambulatory Visit (INDEPENDENT_AMBULATORY_CARE_PROVIDER_SITE_OTHER): Payer: 59 | Admitting: Vascular Surgery

## 2022-02-05 ENCOUNTER — Ambulatory Visit (INDEPENDENT_AMBULATORY_CARE_PROVIDER_SITE_OTHER)
Admission: RE | Admit: 2022-02-05 | Discharge: 2022-02-05 | Disposition: A | Discharge status: Home / Self Care | Payer: 59 | Source: Ambulatory Visit | Attending: Vascular Surgery | Admitting: Vascular Surgery

## 2022-02-05 ENCOUNTER — Ambulatory Visit (HOSPITAL_COMMUNITY)
Admission: RE | Admit: 2022-02-05 | Discharge: 2022-02-05 | Disposition: A | Discharge status: Home / Self Care | Payer: 59 | Source: Ambulatory Visit | Attending: Vascular Surgery | Admitting: Vascular Surgery

## 2022-02-05 VITALS — BP 155/82 | HR 91 | Temp 97.9°F | Resp 20 | Ht 69.0 in | Wt 239.0 lb

## 2022-02-05 DIAGNOSIS — L97909 Non-pressure chronic ulcer of unspecified part of unspecified lower leg with unspecified severity: Secondary | ICD-10-CM

## 2022-02-05 DIAGNOSIS — I70299 Other atherosclerosis of native arteries of extremities, unspecified extremity: Secondary | ICD-10-CM | POA: Diagnosis not present

## 2022-02-05 DIAGNOSIS — I739 Peripheral vascular disease, unspecified: Secondary | ICD-10-CM

## 2022-02-05 DIAGNOSIS — Z48812 Encounter for surgical aftercare following surgery on the circulatory system: Secondary | ICD-10-CM | POA: Diagnosis not present

## 2022-02-05 NOTE — Progress Notes (Signed)
? ? ?REASON FOR VISIT:  ? ?Follow-up after angioplasty and stenting of the right common iliac artery ? ?MEDICAL ISSUES:  ? 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  She has an ABI of 89% on the right multiphasic flow in the right foot.  Her stent is widely patent.  She is on aspirin, Plavix, and a statin.  She quit smoking.  I encouraged her to stay as active as possible.  I have ordered follow-up studies in 1 year and I will see her back at that time.  She is to call sooner if she has problems. ? ? ?HPI:  ? ?Katherine Walsh is a pleasant 46 y.o. female who had presented with wounds on her right first and second toes and an ischemic right forefoot.  She had evidence of infrainguinal arterial occlusive disease.  She underwent an arteriogram on 04/11/2021 and had a right common iliac artery stenosis which was successfully addressed with a VBX covered stent.  Below that she had severe tibial disease from embolization.  She comes in for routine follow-up visit. ? ?She tells me that she quit smoking on 04/10/2021 the day before her angiogram.  She has remained off of cigarettes.  She denies claudication or rest pain.  She has no specific complaints. ? ?Past Medical History:  ?Diagnosis Date  ? COVID   ? Peripheral vascular disease ([place])   ? ? ?History reviewed. No pertinent family history. ? ?SOCIAL HISTORY: ?Social History  ? ?Tobacco Use  ? Smoking status: Former  ?  Packs/day: 0.50  ?  Types: Cigarettes  ?  Quit date: 04/10/2021  ?  Years since quitting: 0.8  ? Smokeless tobacco: Never  ?Substance Use Topics  ? Alcohol use: Yes  ?  Comment: occasional  ? ? ?Allergies  ?Allergen Reactions  ? Codeine Nausea Only  ? Neosporin [Bacitracin-Polymyxin B] Other (See Comments)  ?  Unsure of reaction  ? ? ?Current Outpatient Medications  ?Medication  Sig Dispense Refill  ? aspirin EC 81 MG tablet Take 1 tablet (81 mg total) by mouth daily. Swallow whole. 150 tablet 2  ? atorvastatin (LIPITOR) 10 MG tablet Take 1 tablet (10 mg total) by mouth daily. 30 tablet 11  ? clopidogrel (PLAVIX) 75 MG tablet Take 1 tablet (75 mg total) by mouth daily. 30 tablet 11  ? ibuprofen (ADVIL) 200 MG tablet Take 800 mg by mouth 2 (two) times daily as needed (pain.).    ? ?No current facility-administered medications for this visit.  ? ? ?REVIEW OF SYSTEMS:  ?[X]  denotes positive finding, [ ]  denotes negative finding ?Cardiac  Comments:  ?Chest pain or chest pressure:    ?Shortness of breath upon exertion:    ?Short of breath when lying flat:    ?Irregular heart rhythm:    ?    ?Vascular    ?Pain in calf, thigh, or hip brought on by ambulation:    ?Pain in feet at night that wakes you up from your sleep:     ?Blood clot in your veins:    ?Leg swelling:     ?    ?Pulmonary    ?Oxygen at home:    ?Productive cough:     ?Wheezing:     ?    ?Neurologic    ?Sudden weakness in arms or legs:     ?  Sudden numbness in arms or legs:     ?Sudden onset of difficulty speaking or slurred speech:    ?Temporary loss of vision in one eye:     ?Problems with dizziness:     ?    ?Gastrointestinal    ?Blood in stool:     ?Vomited blood:     ?    ?Genitourinary    ?Burning when urinating:     ?Blood in urine:    ?    ?Psychiatric    ?Major depression:     ?    ?Hematologic    ?Bleeding problems:    ?Problems with blood clotting too easily:    ?    ?Skin    ?Rashes or ulcers:    ?    ?Constitutional    ?Fever or chills:    ? ?PHYSICAL EXAM:  ? ?Vitals:  ? 02/05/22 1005  ?BP: (!) 155/82  ?Pulse: 91  ?Resp: 20  ?Temp: 97.9 ?F (36.6 ?C)  ?SpO2: 95%  ?Weight: 239 lb (108.4 kg)  ?Height: 5\' 9"  (1.753 m)  ? ? ?GENERAL: The patient is a well-nourished female, in no acute distress. The vital signs are documented above. ?CARDIAC: There is a regular rate and rhythm.  ?VASCULAR: I do not detect carotid bruits. ?On  the right side she has a normal femoral pulse.  I cannot palpate pedal pulses.  She does have  brisk Doppler signals in the dorsalis pedis and posterior tibial positions. ?On the left side she has a palpable femoral and posterior tibial pulse.  She has multiphasic flow in the left foot. ?PULMONARY: There is good air exchange bilaterally without wheezing or rales. ?ABDOMEN: Soft and non-tender with normal pitched bowel sounds.  ?MUSCULOSKELETAL: There are no major deformities or cyanosis. ?NEUROLOGIC: No focal weakness or paresthesias are detected. ?SKIN: There are no ulcers or rashes noted. ?PSYCHIATRIC: The patient has a normal affect. ? ?DATA:   ? ?ARTERIAL DOPPLER STUDY: I have independently interpreted her arterial Doppler study today. ? ?On the right side there is a biphasic posterior tibial signal with a triphasic dorsalis pedis signal.  ABIs 89%.  Toe pressures 47 mmHg. ? ?On the left side there is a triphasic dorsalis pedis and posterior tibial signal.  ABIs 100%.  Toe pressures 118 mmHg. ? ?ARTERIAL DUPLEX: I have independently interpreted the patient's arterial duplex scan.  The right common iliac artery stent is widely patent with no areas of stenosis within the stent. ? ? ? ?Deitra Mayo ?[facility] ?Office [phone] ?

## 2022-03-28 ENCOUNTER — Other Ambulatory Visit: Payer: Self-pay | Admitting: Vascular Surgery

## 2022-04-02 ENCOUNTER — Other Ambulatory Visit: Payer: Self-pay

## 2022-04-02 MED ORDER — ATORVASTATIN CALCIUM 10 MG PO TABS: 10.0000 mg | ORAL_TABLET | Freq: Every day | ORAL | 11 refills | Status: DC

## 2022-07-10 ENCOUNTER — Other Ambulatory Visit: Payer: Self-pay

## 2022-07-10 MED ORDER — ASPIRIN 81 MG PO TBEC: 81.0000 mg | DELAYED_RELEASE_TABLET | Freq: Every day | ORAL | 11 refills | Status: DC

## 2022-07-10 NOTE — Telephone Encounter (Signed)
Pt called requesting aspirin refill.  Reviewed pt's chart, refilled ASA 81 mg as prescribed by Dr Edilia Bo, returned call, two identifiers used. Informed pt of refill and to check with pharmacy for pickup. Confirmed understanding.

## 2023-03-05 ENCOUNTER — Other Ambulatory Visit: Payer: Self-pay | Admitting: *Deleted

## 2023-03-05 DIAGNOSIS — L97909 Non-pressure chronic ulcer of unspecified part of unspecified lower leg with unspecified severity: Secondary | ICD-10-CM

## 2023-03-05 DIAGNOSIS — I739 Peripheral vascular disease, unspecified: Secondary | ICD-10-CM

## 2023-03-11 ENCOUNTER — Ambulatory Visit (HOSPITAL_COMMUNITY)
Admission: RE | Admit: 2023-03-11 | Discharge: 2023-03-11 | Disposition: A | Discharge status: Home / Self Care | Payer: 59 | Source: Ambulatory Visit | Attending: Vascular Surgery | Admitting: Vascular Surgery

## 2023-03-11 ENCOUNTER — Encounter: Payer: Self-pay | Admitting: Vascular Surgery

## 2023-03-11 ENCOUNTER — Ambulatory Visit (INDEPENDENT_AMBULATORY_CARE_PROVIDER_SITE_OTHER)
Admission: RE | Admit: 2023-03-11 | Discharge: 2023-03-11 | Disposition: A | Discharge status: Home / Self Care | Payer: 59 | Source: Ambulatory Visit | Attending: Vascular Surgery | Admitting: Vascular Surgery

## 2023-03-11 ENCOUNTER — Ambulatory Visit (INDEPENDENT_AMBULATORY_CARE_PROVIDER_SITE_OTHER): Payer: 59 | Admitting: Vascular Surgery

## 2023-03-11 VITALS — BP 154/96 | HR 101 | Temp 97.9°F | Resp 20 | Ht 69.0 in | Wt 233.0 lb

## 2023-03-11 DIAGNOSIS — I70299 Other atherosclerosis of native arteries of extremities, unspecified extremity: Secondary | ICD-10-CM

## 2023-03-11 DIAGNOSIS — Z48812 Encounter for surgical aftercare following surgery on the circulatory system: Secondary | ICD-10-CM

## 2023-03-11 DIAGNOSIS — L97909 Non-pressure chronic ulcer of unspecified part of unspecified lower leg with unspecified severity: Secondary | ICD-10-CM

## 2023-03-11 DIAGNOSIS — I739 Peripheral vascular disease, unspecified: Secondary | ICD-10-CM

## 2023-03-11 LAB — VAS US ABI WITH/WO TBI
Left ABI: 1.28
Right ABI: 0.83

## 2023-03-11 NOTE — Progress Notes (Signed)
REASON FOR VISIT:   Follow-up of peripheral arterial disease.  MEDICAL ISSUES:   PERIPHERAL ARTERIAL DISEASE: This patient had presented with atheroembolic disease to her right foot and was found to have a significant right common iliac artery stenosis.  This was successfully addressed with angioplasty and stenting.  She quit smoking 2 years ago.  [facility] [employer] and has been exercising and is trying to lose weight.  She is on aspirin, Plavix, and a statin.  I explained to her that she can stop her Plavix at this time.  We do like to check the stent yearly so I will get her on the PA schedule to have an aortoiliac duplex on the right and ABIs in 1 year.  She knows to call sooner if she has problems.   HPI:   Katherine Walsh is a pleasant 47 y.o. female who presented with wounds on her right first and second toes and ischemic right forefoot.  She had evidence of atheroembolic disease and severe tibial disease.  On 04/11/2021 she underwent an arteriogram and placement of a right common iliac artery stent.  She was found to have severe tibial disease.  She quit smoking on 04/10/2021 the day prior to her arteriogram.  When I saw her last she had an ABI of 89% on the right with multiphasic flow in the right foot.  The stent was widely patent.  She was on aspirin, Plavix, and a statin.  She comes in for a 1 year follow-up visit.  Since I saw her last, she has remained off cigarettes now for 2 years.  She has become more active and just joined the [facility].  She is also trying to lose some weight.  She is on aspirin, Plavix, and a statin.  She denies any history of claudication, rest pain, or nonhealing ulcers.  Past Medical History:  Diagnosis Date   COVID    Peripheral vascular disease (HCC)     History reviewed. No pertinent family history.  SOCIAL HISTORY: Social History   Tobacco Use   Smoking status: Former    Packs/day: .5    Types: Cigarettes    Quit date: 04/10/2021    Years  since quitting: 1.9   Smokeless tobacco: Never  Substance Use Topics   Alcohol use: Yes    Comment: occasional    Allergies  Allergen Reactions   Codeine Nausea Only   Neosporin [Bacitracin-Polymyxin B] Other (See Comments)    Unsure of reaction    Current Outpatient Medications  Medication Sig Dispense Refill   aspirin EC 81 MG tablet Take 1 tablet (81 mg total) by mouth daily. Swallow whole. 30 tablet 11   atorvastatin (LIPITOR) 10 MG tablet Take 1 tablet (10 mg total) by mouth daily. 30 tablet 11   clopidogrel (PLAVIX) 75 MG tablet TAKE 1 TABLET(75 MG) BY MOUTH DAILY 30 tablet 11   ibuprofen (ADVIL) 200 MG tablet Take 800 mg by mouth 2 (two) times daily as needed (pain.).     No current facility-administered medications for this visit.    REVIEW OF SYSTEMS:  [X]  denotes positive finding, [ ]  denotes negative finding Cardiac  Comments:  Chest pain or chest pressure:    Shortness of breath upon exertion:    Short of breath when lying flat:    Irregular heart rhythm:        Vascular    Pain in calf, thigh, or hip brought on by ambulation:    Pain  in feet at night that wakes you up from your sleep:     Blood clot in your veins:    Leg swelling:         Pulmonary    Oxygen at home:    Productive cough:     Wheezing:         Neurologic    Sudden weakness in arms or legs:     Sudden numbness in arms or legs:     Sudden onset of difficulty speaking or slurred speech:    Temporary loss of vision in one eye:     Problems with dizziness:         Gastrointestinal    Blood in stool:     Vomited blood:         Genitourinary    Burning when urinating:     Blood in urine:        Psychiatric    Major depression:         Hematologic    Bleeding problems:    Problems with blood clotting too easily:        Skin    Rashes or ulcers:        Constitutional    Fever or chills:     PHYSICAL EXAM:   Vitals:   03/11/23 1004  BP: (!) 154/96  Pulse: (!) 101  Resp:  20  Temp: 97.9 F (36.6 C)  SpO2: 98%  Weight: 233 lb (105.7 kg)  Height: 5\' 9"  (1.753 m)   Body mass index is 34.41 kg/m.  GENERAL: The patient is a well-nourished female, in no acute distress. The vital signs are documented above. CARDIAC: There is a regular rate and rhythm.  VASCULAR: I do not detect carotid bruits. On the right side she has a palpable femoral pulse.  I cannot palpate pedal pulses. On the left side she has a palpable femoral pulse and posterior tibial pulse. PULMONARY: There is good air exchange bilaterally without wheezing or rales. ABDOMEN: Soft and non-tender with normal pitched bowel sounds.  MUSCULOSKELETAL: There are no major deformities or cyanosis. NEUROLOGIC: No focal weakness or paresthesias are detected. SKIN: There are no ulcers or rashes noted. PSYCHIATRIC: The patient has a normal affect.  DATA:    ARTERIAL DOPPLER STUDY: I have independently interpreted her arterial Doppler study today.  On the right side there is a monophasic posterior tibial and dorsalis pedis signal.  ABIs 83%.  Toe pressures 86 mmHg.  On the left side there is a triphasic posterior tibial signal with a biphasic dorsalis pedis signal.  ABIs 100%.  Toe pressures 142 mmHg.  AORTOILIAC DUPLEX: I have independently interpreted the aortoiliac duplex.  On the right side the common iliac artery stent is widely patent without evidence of stenosis.  Waverly Ferrari [facility] [phone]

## 2023-03-23 ENCOUNTER — Other Ambulatory Visit: Payer: Self-pay

## 2023-03-23 DIAGNOSIS — L97909 Non-pressure chronic ulcer of unspecified part of unspecified lower leg with unspecified severity: Secondary | ICD-10-CM

## 2023-03-23 DIAGNOSIS — I739 Peripheral vascular disease, unspecified: Secondary | ICD-10-CM

## 2023-04-01 ENCOUNTER — Other Ambulatory Visit: Payer: Self-pay | Admitting: Vascular Surgery

## 2023-04-06 ENCOUNTER — Other Ambulatory Visit: Payer: Self-pay

## 2023-04-06 MED ORDER — ATORVASTATIN CALCIUM 10 MG PO TABS: 10.0000 mg | ORAL_TABLET | Freq: Every day | ORAL | 2 refills | Status: DC

## 2023-04-06 NOTE — Telephone Encounter (Signed)
Pt called requesting clarification on prescriptions.  Reviewed pt's chart, returned call for clarification, two identifiers used. Informed her that Dr. Edilia Bo asked her to stop the Plavix as of 03/11/23. She should continue the Lipitor, but she does not have a PCP. Instructed her to find a PCP due to the importance of monitoring labs with cholesterol medications. Refill sent in for a 3 mo supply while she finds a PCP. Confirmed understanding.

## 2023-06-02 ENCOUNTER — Other Ambulatory Visit: Payer: Self-pay

## 2023-06-02 MED ORDER — ATORVASTATIN CALCIUM 10 MG PO TABS: 10.0000 mg | ORAL_TABLET | Freq: Every day | ORAL | 2 refills | Status: AC

## 2023-06-02 MED ORDER — ASPIRIN 81 MG PO TBEC: 81.0000 mg | DELAYED_RELEASE_TABLET | Freq: Every day | ORAL | 2 refills | Status: AC

## 2023-06-03 ENCOUNTER — Telehealth: Payer: Self-pay

## 2023-06-03 NOTE — Telephone Encounter (Signed)
Pt called regarding her statin rx that was sent in yesterday to [facility]. She stated they did not receive it, however they received the baby ASA that was also prescribed yesterday. I gave Corinne a VO for this.

## 2024-05-18 ENCOUNTER — Other Ambulatory Visit: Payer: Self-pay | Admitting: *Deleted

## 2024-05-18 DIAGNOSIS — I739 Peripheral vascular disease, unspecified: Secondary | ICD-10-CM

## 2024-05-18 DIAGNOSIS — L97909 Non-pressure chronic ulcer of unspecified part of unspecified lower leg with unspecified severity: Secondary | ICD-10-CM

## 2024-06-01 ENCOUNTER — Ambulatory Visit (HOSPITAL_COMMUNITY)

## 2024-06-26 ENCOUNTER — Other Ambulatory Visit: Payer: Self-pay

## 2024-06-26 DIAGNOSIS — I739 Peripheral vascular disease, unspecified: Secondary | ICD-10-CM

## 2024-06-26 DIAGNOSIS — I771 Stricture of artery: Secondary | ICD-10-CM

## 2024-08-02 NOTE — Progress Notes (Unsigned)
 HISTORY AND PHYSICAL     CC:  follow up. Requesting Provider:  No ref. provider found  HPI: This is a 48 y.o. female who is here today for follow up for PAD.  Pt has hx of arteriogram and placement of a right common iliac artery stent on 04/11/2021 by Dr. Eliza for ischemic wounds.  Pt was last seen 03/11/2023 and at that time, she had quit smoking for 2 years and become more active and had joined the [facility].  She was not having any claudication, rest pain or non healing wounds.   The pt returns today for follow up.  She states that she is nervous to go to any doctor.  She states that Dr. Eliza had put her at ease.  She denies any claudication, rest pain or non healing wounds.  She states that her PCP has increased her Lipitor to 20mg  daily.  She continues to take her asa.  She has since been diagnosed with diabetes and is now on medication.  She continues not to smoke and has not smoked for about 3.5 years.    The pt is on a statin for cholesterol management.    The pt is on an aspirin .    Other AC:  Plavix  The pt is not on medication for hypertension.  The pt is on diabetic medication. Tobacco hx:  former  Pt does not have family hx of AAA.  Past Medical History:  Diagnosis Date   COVID    Peripheral vascular disease     Past Surgical History:  Procedure Laterality Date   ABDOMINAL AORTOGRAM W/LOWER EXTREMITY N/A 04/11/2021   Procedure: ABDOMINAL AORTOGRAM W/LOWER EXTREMITY;  Surgeon: Eliza Lonni RAMAN, MD;  Location: [facility] INVASIVE CV LAB;  Service: Cardiovascular;  Laterality: N/A;   CESAREAN SECTION     CHOLECYSTECTOMY     PERIPHERAL VASCULAR INTERVENTION Right 04/11/2021   Procedure: PERIPHERAL VASCULAR INTERVENTION;  Surgeon: Eliza Lonni RAMAN, MD;  Location: [facility] INVASIVE CV LAB;  Service: Cardiovascular;  Laterality: Right;  right common iliac   TONSILLECTOMY AND ADENOIDECTOMY      Allergies  Allergen Reactions   Codeine Nausea Only   Neosporin  [Bacitracin-Polymyxin B] Other (See Comments)    Unsure of reaction    Current Outpatient Medications  Medication Sig Dispense Refill   aspirin  EC 81 MG tablet Take 1 tablet (81 mg total) by mouth daily. Swallow whole. 30 tablet 2   atorvastatin  (LIPITOR) 10 MG tablet Take 1 tablet (10 mg total) by mouth daily. 30 tablet 2   clopidogrel  (PLAVIX ) 75 MG tablet TAKE 1 TABLET(75 MG) BY MOUTH DAILY 30 tablet 11   ibuprofen (ADVIL) 200 MG tablet Take 800 mg by mouth 2 (two) times daily as needed (pain.).     No current facility-administered medications for this visit.    No family history on file.  Social History   Socioeconomic History   Marital status: Married    Spouse name: Not on file   Number of children: Not on file   Years of education: Not on file   Highest education level: Not on file  Occupational History   Not on file  Tobacco Use   Smoking status: Former    Current packs/day: 0.00    Types: Cigarettes    Quit date: 04/10/2021    Years since quitting: 3.3   Smokeless tobacco: Never  Vaping Use   Vaping status: Never Used  Substance and Sexual Activity   Alcohol use: Yes  Comment: occasional   Drug use: Never   Sexual activity: Not on file  Other Topics Concern   Not on file  Social History Narrative   Not on file   Social Drivers of Health   Financial Resource Strain: Low Risk  (02/21/2024)   Received from [facility]   Overall Financial Resource Strain (CARDIA)    Difficulty of Paying Living Expenses: Not hard at all  Food Insecurity: No Food Insecurity (02/21/2024)   Received from [facility]   Hunger Vital Sign    Within the past 12 months, you worried that your food would run out before you got the money to buy more.: Never true    Within the past 12 months, the food you bought just didn't last and you didn't have money to get more.: Never true  Transportation Needs: No Transportation Needs (02/21/2024)   Received from [facility] -  Transportation    Lack of Transportation (Medical): No    Lack of Transportation (Non-Medical): No  Physical Activity: Insufficiently Active (02/21/2024)   Received from [facility]   Exercise Vital Sign    On average, how many days per week do you engage in moderate to strenuous exercise (like a brisk walk)?: 2 days    On average, how many minutes do you engage in exercise at this level?: 30 min  Stress: No Stress Concern Present (02/21/2024)   Received from [facility] - Occupational Stress Questionnaire    Feeling of Stress : Only a little  Social Connections: Socially Integrated (02/21/2024)   Received from [facility]   Social Network    How would you rate your social network (family, work, friends)?: Good participation with social networks  Intimate Partner Violence: Not At Risk (02/21/2024)   Received from [facility]   HITS    Over the last 12 months how often did your partner physically hurt you?: Never    Over the last 12 months how often did your partner insult you or talk down to you?: Never    Over the last 12 months how often did your partner threaten you with physical harm?: Never    Over the last 12 months how often did your partner scream or curse at you?: Never     REVIEW OF SYSTEMS:   [X]  denotes positive finding, [ ]  denotes negative finding Cardiac  Comments:  Chest pain or chest pressure:    Shortness of breath upon exertion:    Short of breath when lying flat:    Irregular heart rhythm:        Vascular    Pain in calf, thigh, or hip brought on by ambulation:    Pain in feet at night that wakes you up from your sleep:     Blood clot in your veins:    Leg swelling:         Pulmonary    Oxygen at home:    Productive cough:     Wheezing:         Neurologic    Sudden weakness in arms or legs:     Sudden numbness in arms or legs:     Sudden onset of difficulty speaking or slurred speech:    Temporary  loss of vision in one eye:     Problems with dizziness:         Gastrointestinal    Blood in stool:     Vomited blood:  Genitourinary    Burning when urinating:     Blood in urine:        Psychiatric    Major depression:         Hematologic    Bleeding problems:    Problems with blood clotting too easily:        Skin    Rashes or ulcers:        Constitutional    Fever or chills:      PHYSICAL EXAMINATION:  Today's Vitals   08/03/24 0838  BP: (!) 148/101  Pulse: (!) 102  Temp: 97.6 F (36.4 C)  TempSrc: Temporal  Weight: 231 lb 14.4 oz (105.2 kg)  Height: 5' 8 (1.727 m)   Body mass index is 35.26 kg/m.   General:  WDWN in NAD; vital signs documented above Gait: Not observed HENT: WNL, normocephalic Pulmonary: normal non-labored breathing , without wheezing Cardiac: regular HR, without carotid bruits Abdomen: soft, NT; aortic pulse is not palpable Skin: without rashes Vascular Exam/Pulses: Faintly palpable left DP pulse and brisk doppler flow left DP/PT/pero Palpable right DP pulse Bilateral femoral pulses faintly palpable due to body habitus.  Extremities: without ischemic changes, without Gangrene , without cellulitis; without open wounds Musculoskeletal: no muscle wasting or atrophy  Neurologic: A&O X 3 Psychiatric:  The pt has Normal affect.   Non-Invasive Vascular Imaging:   ABI's/TBI's on 08/03/2024: Right:  0.84/0.45 - Great toe pressure: 69 Left:  1.2/0.82 - Great toe pressure: 125  Arterial duplex on 08/03/2024: Abdominal Aorta Findings:  +-------------+-------+----------+----------+---------+--------+--------+  Location    AP (cm)Trans (cm)PSV (cm/s)Waveform ThrombusComments  +-------------+-------+----------+----------+---------+--------+--------+  Proximal                     101                                  +-------------+-------+----------+----------+---------+--------+--------+  Mid                           79                                   +-------------+-------+----------+----------+---------+--------+--------+  Distal                       80                                   +-------------+-------+----------+----------+---------+--------+--------+  RT CIA Distal                 152       triphasic                  +-------------+-------+----------+----------+---------+--------+--------+  RT EIA Prox                   139       triphasic                  +-------------+-------+----------+----------+---------+--------+--------+  RT EIA Mid                    155       triphasic                  +-------------+-------+----------+----------+---------+--------+--------+  RT EIA Distal                 93        triphasic                  +-------------+-------+----------+----------+---------+--------+--------+   Right Stent(s):  +---------------+--------+---------------+---------+------------------+  CIA           PSV cm/sStenosis       Waveform Comments            +---------------+--------+---------------+---------+------------------+  Prox to Stent  94                     triphasic                    +---------------+--------+---------------+---------+------------------+  Proximal Stent 113                    triphasic                    +---------------+--------+---------------+---------+------------------+  Mid Stent      172                    triphasic                    +---------------+--------+---------------+---------+------------------+  Distal Stent   229     50-99% stenosistriphasiclower end of range  +---------------+--------+---------------+---------+------------------+  Distal to Stent205                    triphasic                    +---------------+--------+---------------+---------+------------------+   Previous ABI's/TBI's on 03/11/2023: Right:  0.83/0.58 - Great toe pressure: 86 Left:   1.28/0.96 - Great toe pressure:  142  Previous arterial duplex on 03/11/2023: Summary:  The right common iliac artery stent appears patent with no evidence for restenosis. Stent walls not well visualized, limited visualization     ASSESSMENT/PLAN:: 48 y.o. female here for follow up for PAD with hx of arteriogram and placement of a right common iliac artery stent on 04/11/2021 by Dr. Eliza for ischemic wounds.   -pt remains asymptomatic without claudication, rest pain or non healing wounds.  Her ABI remains stable.  She has a mild stenosis of the distal right CIA stent.  Discussed with her that we will keep an eye on this and recheck it again in 6 months.  Discussed with her that if she develops rest pain or non healing wounds, we may need to get a CTA or angiogram to evaluate stent.   -fortunately, she quit smoking 3.5 years ago.  Discussed the importance of remaining with smoking cessation especially in light on being diagnosed with diabetes.   -discussed with her to continue taking her asa and statin, which the dose has been increased since last visit, good BP control, healthy weight, increased walking, good DM control. -she was hypertensive today.  She states she always runs high at the doctor.  Discussed getting a blood pressure cuff and taking her pressure twice a day and recording it and taking it to her PCP in a couple of weeks so they can have a good idea what her pressures normally run.   -continue asa/statin -discussed importance of increased walking daily -pt will f/u in 6 months with aortoiliac duplex and ABI.  She will call sooner if she has any issues.     Lucie Apt, [facility] [phone]  Clinic MD:   [place]

## 2024-08-03 ENCOUNTER — Ambulatory Visit

## 2024-08-03 ENCOUNTER — Ambulatory Visit (HOSPITAL_BASED_OUTPATIENT_CLINIC_OR_DEPARTMENT_OTHER)
Admission: RE | Admit: 2024-08-03 | Discharge: 2024-08-03 | Disposition: A | Discharge status: Home / Self Care | Source: Ambulatory Visit | Attending: Vascular Surgery | Admitting: Vascular Surgery

## 2024-08-03 ENCOUNTER — Ambulatory Visit (HOSPITAL_COMMUNITY)
Admission: RE | Admit: 2024-08-03 | Discharge: 2024-08-03 | Disposition: A | Discharge status: Home / Self Care | Source: Ambulatory Visit | Attending: Vascular Surgery | Admitting: Vascular Surgery

## 2024-08-03 ENCOUNTER — Encounter (HOSPITAL_COMMUNITY)

## 2024-08-03 VITALS — BP 148/101 | HR 102 | Temp 97.6°F | Ht 68.0 in | Wt 231.9 lb

## 2024-08-03 DIAGNOSIS — I771 Stricture of artery: Secondary | ICD-10-CM | POA: Diagnosis not present

## 2024-08-03 DIAGNOSIS — I739 Peripheral vascular disease, unspecified: Secondary | ICD-10-CM | POA: Insufficient documentation

## 2024-08-03 LAB — VAS US ABI WITH/WO TBI
Left ABI: 1.25
Right ABI: 0.84

## 2024-08-04 ENCOUNTER — Other Ambulatory Visit: Payer: Self-pay | Admitting: Vascular Surgery

## 2024-08-04 DIAGNOSIS — I739 Peripheral vascular disease, unspecified: Secondary | ICD-10-CM

## 2024-08-04 DIAGNOSIS — I771 Stricture of artery: Secondary | ICD-10-CM

## 2024-08-04 DIAGNOSIS — Z48812 Encounter for surgical aftercare following surgery on the circulatory system: Secondary | ICD-10-CM

## 2025-02-01 ENCOUNTER — Encounter (HOSPITAL_COMMUNITY)

## 2025-02-01 ENCOUNTER — Ambulatory Visit
